# Patient Record
Sex: Male | Born: 2005 | Race: Black or African American | Hispanic: No | Marital: Single | Smoking: Former smoker
Health system: Southern US, Community
[De-identification: ages and names within clinical notes are randomized; demographics above are authoritative.]

## PROBLEM LIST (undated history)

## (undated) DIAGNOSIS — L732 Hidradenitis suppurativa: Secondary | ICD-10-CM

## (undated) DIAGNOSIS — F909 Attention-deficit hyperactivity disorder, unspecified type: Secondary | ICD-10-CM

## (undated) HISTORY — PX: NO PAST SURGERIES: SHX2092

---
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
## 2024-01-10 ENCOUNTER — Encounter (HOSPITAL_COMMUNITY): Payer: Self-pay | Admitting: Emergency Medicine

## 2024-01-10 ENCOUNTER — Ambulatory Visit (INDEPENDENT_AMBULATORY_CARE_PROVIDER_SITE_OTHER)

## 2024-01-10 ENCOUNTER — Other Ambulatory Visit: Payer: Self-pay

## 2024-01-10 ENCOUNTER — Ambulatory Visit (HOSPITAL_COMMUNITY)
Admission: EM | Admit: 2024-01-10 | Discharge: 2024-01-10 | Disposition: A | Discharge status: Home / Self Care | Attending: Internal Medicine | Admitting: Internal Medicine

## 2024-01-10 DIAGNOSIS — S62101A Fracture of unspecified carpal bone, right wrist, initial encounter for closed fracture: Secondary | ICD-10-CM | POA: Diagnosis not present

## 2024-01-10 DIAGNOSIS — Z8781 Personal history of (healed) traumatic fracture: Secondary | ICD-10-CM | POA: Insufficient documentation

## 2024-01-10 DIAGNOSIS — S62390D Other fracture of second metacarpal bone, right hand, subsequent encounter for fracture with routine healing: Secondary | ICD-10-CM | POA: Insufficient documentation

## 2024-01-10 DIAGNOSIS — M79641 Pain in right hand: Secondary | ICD-10-CM | POA: Diagnosis not present

## 2024-01-10 DIAGNOSIS — Z202 Contact with and (suspected) exposure to infections with a predominantly sexual mode of transmission: Secondary | ICD-10-CM | POA: Diagnosis not present

## 2024-01-10 DIAGNOSIS — S62320A Displaced fracture of shaft of second metacarpal bone, right hand, initial encounter for closed fracture: Secondary | ICD-10-CM | POA: Diagnosis not present

## 2024-01-10 DIAGNOSIS — Z9189 Other specified personal risk factors, not elsewhere classified: Secondary | ICD-10-CM | POA: Insufficient documentation

## 2024-01-10 DIAGNOSIS — Z9889 Other specified postprocedural states: Secondary | ICD-10-CM | POA: Diagnosis not present

## 2024-01-10 MED ORDER — CEFTRIAXONE SODIUM 500 MG IJ SOLR
500.0000 mg | INTRAMUSCULAR | Status: DC
  Administered 2024-01-10: 500 mg via INTRAMUSCULAR

## 2024-01-10 MED ORDER — CEFTRIAXONE SODIUM 500 MG IJ SOLR
INTRAMUSCULAR | Status: AC
  Filled 2024-01-10: qty 500

## 2024-01-10 MED ORDER — STERILE WATER FOR INJECTION IJ SOLN
INTRAMUSCULAR | Status: AC
  Filled 2024-01-10: qty 10

## 2024-01-10 NOTE — ED Triage Notes (Signed)
 Pt is requesting to be check for STD and to have x ray done to right hand due to pain for the past 3 days.

## 2024-01-10 NOTE — ED Provider Notes (Signed)
 [facility]    CSN: [id number] Arrival date & time: 01/10/24  1225      History   Chief Complaint Chief Complaint  Patient presents with   Exposure to STD   Hand Pain    HPI Craig Vang is a 18 y.o. male.   Craig Vang is a 18 y.o. male presenting for chief complaint of Exposure to STD and Hand Pain. His recent unprotected sexual partner recently tested positive for gonorrhea and trichomonas at urgent care. Patient would like to be tested for all STDs including HIV and syphilis. No other known exposures to STDs. He denies urinary symptoms, penile discharge, penile rash, penile itching, N/V/D, abdominal pain, rash, and fever/chills.   Additionally would like evaluation of postoperative pain to right hand that started 2 days ago. He is status post ORIF of right index metacarpal fracture. Surgery performed by Dr. Merlyn Lot on 11/04/2023, he attended a few follow-up appointment initially and then stopped going because he missed one of his appointments. Pain over the surgical site started 2 days ago and is worsened by movement. Requests x-ray today to confirm rod/screws are in place. No redness, swelling, pus from the incision. Denies new injuries and numbness/tingling distally to surgical site. Taking tylenol for pain with minimal relief.    Exposure to STD  Hand Pain    Past Medical History:  Diagnosis Date   ADHD (attention deficit hyperactivity disorder)    Hidradenitis suppurativa     There are no active problems to display for this patient.   Past Surgical History:  Procedure Laterality Date   NO PAST SURGERIES     OPEN REDUCTION INTERNAL FIXATION (ORIF) METACARPAL Right 11/04/2023   Procedure: OPEN REDUCTION INTERNAL FIXATION RIGHT INDEX METACARPAL FRACTURE;  Surgeon: Betha Loa, MD;  Location: [facility];  Service: Orthopedics;  Laterality: Right;       Home Medications    Prior to Admission medications   Medication Sig Start Date  End Date Taking? Authorizing Provider  oxyCODONE-acetaminophen (PERCOCET) 5-325 MG tablet Take 1 tablet by mouth every 6 (six) hours as needed. 11/04/23   Betha Loa, MD    Family History History reviewed. No pertinent family history.  Social History Social History   Tobacco Use   Smoking status: Some Days    Types: Cigars  Vaping Use   Vaping status: Some Days  Substance Use Topics   Alcohol use: Yes    Comment: occasionally   Drug use: Yes    Types: Marijuana     Allergies   Patient has no known allergies.   Review of Systems Review of Systems Per HPI  Physical Exam Triage Vital Signs ED Triage Vitals [01/10/24 1316]  Encounter Vitals Group     BP 126/85     Systolic BP Percentile      Diastolic BP Percentile      Pulse Rate 92     Resp 18     Temp 98 F (36.7 C)     Temp Source Oral     SpO2 98 %     Weight      Height      Head Circumference      Peak Flow      Pain Score 8     Pain Loc      Pain Education      Exclude from Growth Chart    No data found.  Updated Vital Signs BP 126/85 (BP Location: Right Arm)   Pulse  92   Temp 98 F (36.7 C) (Oral)   Resp 18   SpO2 98%   Visual Acuity Right Eye Distance:   Left Eye Distance:   Bilateral Distance:    Right Eye Near:   Left Eye Near:    Bilateral Near:     Physical Exam Vitals and nursing note reviewed.  Constitutional:      Appearance: He is not ill-appearing or toxic-appearing.  HENT:     Head: Normocephalic and atraumatic.     Right Ear: Hearing and external ear normal.     Left Ear: Hearing and external ear normal.     Nose: Nose normal.     Mouth/Throat:     Lips: Pink.  Eyes:     General: Lids are normal. Vision grossly intact. Gaze aligned appropriately.     Extraocular Movements: Extraocular movements intact.     Conjunctiva/sclera: Conjunctivae normal.  Pulmonary:     Effort: Pulmonary effort is normal.  Genitourinary:    Comments: Deferred. Musculoskeletal:      Right wrist: Normal.     Right hand: Tenderness (TTP over the dorsum of the right 2nd metacarpal to the medial portion of the surgical incision.) present. No swelling, deformity, lacerations or bony tenderness. Normal range of motion. Normal strength. Normal sensation. There is no disruption of two-point discrimination. Normal capillary refill. Normal pulse.       Hands:     Cervical back: Neck supple.     Comments: Surgical incision closely approximated, no wound dehiscence. +2 right radial pulse. 5/5 grip strength. Sensation intact distally. Less than 3 cap refill.   Skin:    General: Skin is warm and dry.     Capillary Refill: Capillary refill takes less than 2 seconds.     Findings: No rash.  Neurological:     General: No focal deficit present.     Mental Status: He is alert and oriented to person, place, and time. Mental status is at baseline.     Cranial Nerves: No dysarthria or facial asymmetry.  Psychiatric:        Mood and Affect: Mood normal.        Speech: Speech normal.        Behavior: Behavior normal.        Thought Content: Thought content normal.        Judgment: Judgment normal.      UC Treatments / Results  Labs (all labs ordered are listed, but only abnormal results are displayed) Labs Reviewed  RPR  HIV ANTIBODY (ROUTINE TESTING W REFLEX)  CYTOLOGY, (ORAL, ANAL, URETHRAL) ANCILLARY ONLY    EKG   Radiology DG Hand Complete Right Result Date: 01/10/2024 CLINICAL DATA:  Recent hand surgery EXAM: RIGHT HAND - COMPLETE 3+ VIEW COMPARISON:  None Available. FINDINGS: Internal plate fixation of midshaft second metacarpal fracture. Callus about the midshaft fracture. No complicating features. No soft tissue abnormality identified. IMPRESSION: Internal fixation midshaft second carpal fracture without radiographic complication Electronically Signed   By: Genevive Bi M.D.   On: 01/10/2024 14:38    Procedures Procedures (including critical care  time)  Medications Ordered in UC Medications  cefTRIAXone (ROCEPHIN) injection 500 mg (500 mg Intramuscular Given 01/10/24 1430)    Initial Impression / Assessment and Plan / UC Course  I have reviewed the triage vital signs and the nursing notes.  Pertinent labs & imaging results that were available during my care of the patient were reviewed by me and considered in my  medical decision making (see chart for details).   1. Right hand pain, status post ORIF of fracture, closed nondisplaced fracture of other part of second metacarpal bone of right hand subsequent encounter Right hand x-rays show no acute bony abnormality.  Routine healing of second metacarpal bone fracture.  Advise follow-up with Dr. Merlyn Lot hand surgeon for ongoing evaluation and management of postoperative hand pain.  May use Tylenol and ibuprofen as needed for pain and inflammation.  2.  At risk for STD due to unprotected sex, exposure to gonorrhea, exposure to trichomonas High clinical suspicion for gonorrhea infection given presentation, therefore will go ahead and empirically treat with rocephin.   STI labs pending, will notify patient of positive results and treat accordingly when labs result for other infection(s). Patient agrees to HIV and syphilis testing today.   Safe sex precautions discussed, advised to notify sexual partners of positive test results.    Counseled patient on potential for adverse effects with medications prescribed/recommended today, strict ER and return-to-clinic precautions discussed, patient verbalized understanding.   Final Clinical Impressions(s) / UC Diagnoses   Final diagnoses:  Right hand pain  Status post open reduction and internal fixation (ORIF) of fracture  Closed nondisplaced fracture of other part of second metacarpal bone of right hand with routine healing, subsequent encounter  At risk for sexually transmitted disease due to unprotected sex  Exposure to gonorrhea  Exposure to  trichomonas     Discharge Instructions      Your hand x-rays look like your fracture is healing normally.  Screws are in place.  Schedule a follow-up appointment with hand surgeon.   STD testing pending, this will take 2-3 days to result. We will only call you if your testing is positive for any infection(s) and we will provide treatment.  Avoid sexual intercourse until your STD results come back.  If any of your STD results are positive, you will need to avoid sexual intercourse for 7 days while you are being treated to prevent spread of STD.  Condom use is the best way to prevent spread of STDs. Notify partner(s) of any positive results.  Return to urgent care as needed.      ED Prescriptions   None    PDMP not reviewed this encounter.   Carlisle Beers, [place] 01/10/24 1454

## 2024-01-10 NOTE — Discharge Instructions (Signed)
 Your hand x-rays look like your fracture is healing normally.  Screws are in place.  Schedule a follow-up appointment with hand surgeon.   STD testing pending, this will take 2-3 days to result. We will only call you if your testing is positive for any infection(s) and we will provide treatment.  Avoid sexual intercourse until your STD results come back.  If any of your STD results are positive, you will need to avoid sexual intercourse for 7 days while you are being treated to prevent spread of STD.  Condom use is the best way to prevent spread of STDs. Notify partner(s) of any positive results.  Return to urgent care as needed.

## 2024-01-10 NOTE — ED Notes (Signed)
 Pt refused blood draw . This Clinical research associate reported to provider Pt refused blood work.

## 2024-01-12 LAB — CYTOLOGY, (ORAL, ANAL, URETHRAL) ANCILLARY ONLY
Chlamydia: NEGATIVE
Comment: NEGATIVE
Comment: NEGATIVE
Comment: NORMAL
Neisseria Gonorrhea: NEGATIVE
Trichomonas: POSITIVE — AB

## 2024-01-13 ENCOUNTER — Telehealth (HOSPITAL_COMMUNITY): Payer: Self-pay

## 2024-01-13 MED ORDER — METRONIDAZOLE 500 MG PO TABS
2000.0000 mg | ORAL_TABLET | Freq: Once | ORAL | 0 refills | Status: AC
Start: 2024-01-13 — End: 2024-01-13

## 2024-01-13 NOTE — Telephone Encounter (Signed)
 Per protocol, pt requires tx with metronidazole. Attempted to reach patient x1. LVM.  Rx sent to pharmacy on file.

## 2024-03-29 DIAGNOSIS — Z113 Encounter for screening for infections with a predominantly sexual mode of transmission: Secondary | ICD-10-CM | POA: Diagnosis not present

## 2024-05-22 ENCOUNTER — Ambulatory Visit (HOSPITAL_COMMUNITY)
Admission: EM | Admit: 2024-05-22 | Discharge: 2024-05-22 | Disposition: A | Discharge status: Home / Self Care | Attending: Physician Assistant | Admitting: Physician Assistant

## 2024-05-22 ENCOUNTER — Encounter (HOSPITAL_COMMUNITY): Payer: Self-pay

## 2024-05-22 DIAGNOSIS — Z113 Encounter for screening for infections with a predominantly sexual mode of transmission: Secondary | ICD-10-CM | POA: Diagnosis not present

## 2024-05-22 LAB — HIV ANTIBODY (ROUTINE TESTING W REFLEX): HIV Screen 4th Generation wRfx: NONREACTIVE

## 2024-05-22 NOTE — ED Provider Notes (Signed)
 [facility]    CSN: [id number] Arrival date & time: 05/22/24  1654      History   Chief Complaint Chief Complaint  Patient presents with   SEXUALLY TRANSMITTED DISEASE    HPI Craig Vang is a 18 y.o. male.   Patient presents for STD screening.  He denies symptoms at this time.    Past Medical History:  Diagnosis Date   ADHD (attention deficit hyperactivity disorder)    Hidradenitis suppurativa     There are no active problems to display for this patient.   Past Surgical History:  Procedure Laterality Date   NO PAST SURGERIES     OPEN REDUCTION INTERNAL FIXATION (ORIF) METACARPAL Right 11/04/2023   Procedure: OPEN REDUCTION INTERNAL FIXATION RIGHT INDEX METACARPAL FRACTURE;  Surgeon: Murrell Drivers, MD;  Location: [facility];  Service: Orthopedics;  Laterality: Right;       Home Medications    Prior to Admission medications   Medication Sig Start Date End Date Taking? Authorizing Provider  oxyCODONE -acetaminophen  (PERCOCET) 5-325 MG tablet Take 1 tablet by mouth every 6 (six) hours as needed. 11/04/23   Murrell Drivers, MD    Family History History reviewed. No pertinent family history.  Social History Social History   Tobacco Use   Smoking status: Some Days    Types: Cigars  Vaping Use   Vaping status: Some Days  Substance Use Topics   Alcohol use: Yes    Comment: occasionally   Drug use: Yes    Types: Marijuana     Allergies   Patient has no known allergies.   Review of Systems Review of Systems  Constitutional:  Negative for chills and fever.  HENT:  Negative for ear pain and sore throat.   Eyes:  Negative for pain and visual disturbance.  Respiratory:  Negative for cough and shortness of breath.   Cardiovascular:  Negative for chest pain and palpitations.  Gastrointestinal:  Negative for abdominal pain and vomiting.  Genitourinary:  Negative for dysuria and hematuria.  Musculoskeletal:  Negative for arthralgias  and back pain.  Skin:  Negative for color change and rash.  Neurological:  Negative for seizures and syncope.  All other systems reviewed and are negative.    Physical Exam Triage Vital Signs ED Triage Vitals [05/22/24 1740]  Encounter Vitals Group     BP 131/78     Girls Systolic BP Percentile      Girls Diastolic BP Percentile      Boys Systolic BP Percentile      Boys Diastolic BP Percentile      Pulse Rate 76     Resp 16     Temp 98.3 F (36.8 C)     Temp Source Oral     SpO2 97 %     Weight      Height      Head Circumference      Peak Flow      Pain Score 0     Pain Loc      Pain Education      Exclude from Growth Chart    No data found.  Updated Vital Signs BP 131/78 (BP Location: Left Arm)   Pulse 76   Temp 98.3 F (36.8 C) (Oral)   Resp 16   SpO2 97%   Visual Acuity Right Eye Distance:   Left Eye Distance:   Bilateral Distance:    Right Eye Near:   Left Eye Near:    Bilateral  Near:     Physical Exam Vitals and nursing note reviewed.  Constitutional:      General: He is not in acute distress.    Appearance: He is well-developed.  HENT:     Head: Normocephalic and atraumatic.  Eyes:     Conjunctiva/sclera: Conjunctivae normal.  Cardiovascular:     Rate and Rhythm: Normal rate and regular rhythm.     Heart sounds: No murmur heard. Pulmonary:     Effort: Pulmonary effort is normal. No respiratory distress.     Breath sounds: Normal breath sounds.  Abdominal:     Palpations: Abdomen is soft.     Tenderness: There is no abdominal tenderness.  Musculoskeletal:        General: No swelling.     Cervical back: Neck supple.  Skin:    General: Skin is warm and dry.     Capillary Refill: Capillary refill takes less than 2 seconds.  Neurological:     Mental Status: He is alert.  Psychiatric:        Mood and Affect: Mood normal.      UC Treatments / Results  Labs (all labs ordered are listed, but only abnormal results are displayed) Labs  Reviewed  HIV ANTIBODY (ROUTINE TESTING W REFLEX)  RPR  CYTOLOGY, (ORAL, ANAL, URETHRAL) ANCILLARY ONLY    EKG   Radiology No results found.  Procedures Procedures (including critical care time)  Medications Ordered in UC Medications - No data to display  Initial Impression / Assessment and Plan / UC Course  I have reviewed the triage vital signs and the nursing notes.  Pertinent labs & imaging results that were available during my care of the patient were reviewed by me and considered in my medical decision making (see chart for details).     Cytology self swab in clinic today.  HIV and RPR done as well.  Pending results.  Will initiate treatment if indicated based on results. Final Clinical Impressions(s) / UC Diagnoses   Final diagnoses:  Screening examination for STD (sexually transmitted disease)     Discharge Instructions      Will call with test results and initiate treatment if indicated Abstain from sexual activity until results are back and any necessary treatment is completed.    ED Prescriptions   None    PDMP not reviewed this encounter.   Ward, Harlene PEDLAR, PA-C 05/22/24 1800

## 2024-05-22 NOTE — ED Triage Notes (Signed)
 Pt states he wants to be tested for STD's denies having any symptoms.

## 2024-05-22 NOTE — Discharge Instructions (Addendum)
 Will call with test results and initiate treatment if indicated Abstain from sexual activity until results are back and any necessary treatment is completed.

## 2024-05-23 LAB — RPR: RPR Ser Ql: NONREACTIVE

## 2024-05-24 LAB — CYTOLOGY, (ORAL, ANAL, URETHRAL) ANCILLARY ONLY
Chlamydia: NEGATIVE
Comment: NEGATIVE
Comment: NEGATIVE
Comment: NORMAL
Neisseria Gonorrhea: NEGATIVE
Trichomonas: NEGATIVE

## 2024-06-25 ENCOUNTER — Ambulatory Visit (HOSPITAL_COMMUNITY): Admission: EM | Admit: 2024-06-25 | Discharge: 2024-06-25 | Discharge status: Against Medical Advice

## 2024-06-25 NOTE — ED Notes (Signed)
Called from waiting area without response. 

## 2024-06-25 NOTE — ED Notes (Signed)
No response from waiting room  

## 2024-06-25 NOTE — ED Notes (Signed)
 No response from waiting area when pt called.

## 2024-09-16 ENCOUNTER — Ambulatory Visit (HOSPITAL_COMMUNITY): Admission: EM | Admit: 2024-09-16 | Discharge: 2024-09-16 | Disposition: A | Discharge status: Home / Self Care

## 2024-09-16 ENCOUNTER — Encounter (HOSPITAL_COMMUNITY): Payer: Self-pay

## 2024-09-16 DIAGNOSIS — J039 Acute tonsillitis, unspecified: Secondary | ICD-10-CM | POA: Insufficient documentation

## 2024-09-16 DIAGNOSIS — J029 Acute pharyngitis, unspecified: Secondary | ICD-10-CM | POA: Insufficient documentation

## 2024-09-16 DIAGNOSIS — Z113 Encounter for screening for infections with a predominantly sexual mode of transmission: Secondary | ICD-10-CM | POA: Diagnosis not present

## 2024-09-16 LAB — HEPATITIS PANEL, ACUTE
HCV Ab: NONREACTIVE
Hep A IgM: NONREACTIVE
Hep B C IgM: NONREACTIVE
Hepatitis B Surface Ag: NONREACTIVE

## 2024-09-16 LAB — POCT RAPID STREP A (OFFICE): Rapid Strep A Screen: NEGATIVE

## 2024-09-16 LAB — HIV ANTIBODY (ROUTINE TESTING W REFLEX): HIV Screen 4th Generation wRfx: NONREACTIVE

## 2024-09-16 LAB — POCT MONO SCREEN (KUC): Mono, POC: NEGATIVE

## 2024-09-16 MED ORDER — NAPROXEN 500 MG PO TABS: 500.0000 mg | ORAL_TABLET | Freq: Two times a day (BID) | ORAL | 0 refills | Status: AC

## 2024-09-16 MED ORDER — PREDNISONE 10 MG PO TABS: 20.0000 mg | ORAL_TABLET | Freq: Every day | ORAL | 0 refills | Status: AC

## 2024-09-16 NOTE — ED Triage Notes (Signed)
 Patient here today to be tested for all Stds. Patient states that his throat has been feeling tight for the past 2 weeks.

## 2024-09-16 NOTE — ED Provider Notes (Signed)
 [facility]    CSN: [id number] Arrival date & time: 09/16/24  1104      History   Chief Complaint Chief Complaint  Patient presents with   SEXUALLY TRANSMITTED DISEASE    HPI Craig Vang is a 18 y.o. male.   HPI  Past Medical History:  Diagnosis Date   ADHD (attention deficit hyperactivity disorder)    Hidradenitis suppurativa     There are no active problems to display for this patient.   Past Surgical History:  Procedure Laterality Date   NO PAST SURGERIES     OPEN REDUCTION INTERNAL FIXATION (ORIF) METACARPAL Right 11/04/2023   Procedure: OPEN REDUCTION INTERNAL FIXATION RIGHT INDEX METACARPAL FRACTURE;  Surgeon: Murrell Drivers, MD;  Location: [facility];  Service: Orthopedics;  Laterality: Right;       Home Medications    Prior to Admission medications   Medication Sig Start Date End Date Taking? Authorizing Provider  predniSONE (DELTASONE) 10 MG tablet Take 2 tablets (20 mg total) by mouth daily for 5 days. 09/16/24 09/21/24 Yes Dunaway, Krystal HERO, DO    Family History History reviewed. No pertinent family history.  Social History Social History   Tobacco Use   Smoking status: Former    Types: Systems Developer   Vaping status: Former  Substance Use Topics   Alcohol use: Yes    Comment: occasionally   Drug use: Yes    Types: Marijuana     Allergies   Patient has no known allergies.   Review of Systems Review of Systems   Physical Exam Triage Vital Signs ED Triage Vitals  Encounter Vitals Group     BP 09/16/24 1146 119/71     Girls Systolic BP Percentile --      Girls Diastolic BP Percentile --      Boys Systolic BP Percentile --      Boys Diastolic BP Percentile --      Pulse Rate 09/16/24 1146 95     Resp 09/16/24 1146 16     Temp 09/16/24 1146 99.6 F (37.6 C)     Temp Source 09/16/24 1146 Oral     SpO2 09/16/24 1146 96 %     Weight --      Height --      Head Circumference --      Peak Flow --       Pain Score 09/16/24 1145 0     Pain Loc --      Pain Education --      Exclude from Growth Chart --    No data found.  Updated Vital Signs BP 119/71 (BP Location: Left Arm)   Pulse 95   Temp 99.6 F (37.6 C) (Oral)   Resp 16   SpO2 96%   Visual Acuity Right Eye Distance:   Left Eye Distance:   Bilateral Distance:    Right Eye Near:   Left Eye Near:    Bilateral Near:     Physical Exam   UC Treatments / Results  Labs (all labs ordered are listed, but only abnormal results are displayed) Labs Reviewed  HIV ANTIBODY (ROUTINE TESTING W REFLEX)  RPR  HEPATITIS PANEL, ACUTE  POCT MONO SCREEN ([facility])  POCT RAPID STREP A (OFFICE)  CYTOLOGY, (ORAL, ANAL, URETHRAL) ANCILLARY ONLY    EKG   Radiology No results found.  Procedures Procedures (including critical care time)  Medications Ordered in UC Medications - No data to display  Initial Impression /  Assessment and Plan / UC Course  I have reviewed the triage vital signs and the nursing notes.  Pertinent labs & imaging results that were available during my care of the patient were reviewed by me and considered in my medical decision making (see chart for details).      Final Clinical Impressions(s) / UC Diagnoses   Final diagnoses:  Screen for STD (sexually transmitted disease)  Acute tonsillitis, unspecified etiology   Discharge Instructions   None    ED Prescriptions     Medication Sig Dispense Auth. Provider   predniSONE (DELTASONE) 10 MG tablet Take 2 tablets (20 mg total) by mouth daily for 5 days. 10 tablet Dunaway, Todd M, DO      PDMP not reviewed this encounter.   Darral Longs, MD 09/16/24 [phone]

## 2024-09-16 NOTE — ED Provider Notes (Signed)
  PCP: Robynn Ip, MD Chief Complaint: SEXUALLY TRANSMITTED DISEASE    Subjective:   HPI: Patient is a 18 y.o. male here for STD screening as well as sore throat for 2 weeks.  Patient states that he comes in periodically for all STD screenings just because he likes to know.  He denies any risky sexual behavior.  He states for the last 2 weeks he has had a sore throat, and the feeling of glass anytime he swallows.  He denies any other symptoms of fever, congestion, cough, body aches, chills.  He states that sometimes he has some difficulty with breathing with this.  Past Medical History:  Diagnosis Date   ADHD (attention deficit hyperactivity disorder)    Hidradenitis suppurativa     No current facility-administered medications on file prior to encounter.   No current outpatient medications on file prior to encounter.    BP 119/71 (BP Location: Left Arm)   Pulse 95   Temp 99.6 F (37.6 C) (Oral)   Resp 16   SpO2 96%        Objective:   Gen: Well developed, well nourished male in no acute distress. HEENT: Pupils equal, round, and reactive to light.  Conjunctiva non-injected.  Nares patent without discharge.  Oral mucosa is moist and pink.  Posterior pharynx is erythematous, tonsils are +3-4, exudate visualized, anterior cervical lymphadenopathy palpable, bilateral tympanic membranes are pearly gray, no masses seen in the posterior pharynx CV: Regular rate and rhythm without murmurs, gallops, or rubs. Lungs: Clear to auscultation bilaterally with good effort MSK: Joints and muscles are symmetrical with no swelling, redness, or deformity. Ext: No cyanosis, clubbing, or edema.  Assessment/Plan:   Craig Vang is a 18 y.o. male who was seen today for the following: 1. Acute pharyngitis, unspecified etiology (Primary) 2. Tonsillitis  - POC mono screen - POC rapid strep A - both tests negative  - vitals stable  - Due to size of tonsils will send in a short course of  steroids  - Should there be any changes to patient's symptoms, he is to seek emergency medical care  - Follow up as needed   2. Screen for STD (sexually transmitted disease) - Pending, C/G/T - Pending HIV, RPR, Hep Panel  - Will call with results   Follow-up/Education:   May return sooner as needed and encouraged to call/e-mail for additional questions or  worsening symptoms in the interim.  Krystal Lowing, DO Sports Medicine Fellow 09/16/2024 1:04 PM  Disclaimer: This transcription was electronically signed. It was transcribed by [place] and may contain errors in the text that were not recognized on proofreading.     Lowing Krystal HERO, DO 09/16/24 1305

## 2024-09-17 ENCOUNTER — Ambulatory Visit (HOSPITAL_COMMUNITY): Payer: Self-pay

## 2024-09-17 LAB — CYTOLOGY, (ORAL, ANAL, URETHRAL) ANCILLARY ONLY
Chlamydia: NEGATIVE
Comment: NEGATIVE
Comment: NEGATIVE
Comment: NORMAL
Neisseria Gonorrhea: POSITIVE — AB
Trichomonas: NEGATIVE

## 2024-09-17 LAB — SYPHILIS: RPR W/REFLEX TO RPR TITER AND TREPONEMAL ANTIBODIES, TRADITIONAL SCREENING AND DIAGNOSIS ALGORITHM: RPR Ser Ql: NONREACTIVE

## 2024-10-01 ENCOUNTER — Ambulatory Visit (HOSPITAL_COMMUNITY)
Admission: EM | Admit: 2024-10-01 | Discharge: 2024-10-01 | Disposition: A | Discharge status: Home / Self Care | Source: Ambulatory Visit | Attending: Emergency Medicine | Admitting: Emergency Medicine

## 2024-10-01 ENCOUNTER — Encounter (HOSPITAL_COMMUNITY): Payer: Self-pay

## 2024-10-01 DIAGNOSIS — A549 Gonococcal infection, unspecified: Secondary | ICD-10-CM

## 2024-10-01 DIAGNOSIS — J029 Acute pharyngitis, unspecified: Secondary | ICD-10-CM | POA: Diagnosis not present

## 2024-10-01 LAB — POCT RAPID STREP A (OFFICE): Rapid Strep A Screen: NEGATIVE

## 2024-10-01 MED ORDER — CEFTRIAXONE SODIUM 500 MG IJ SOLR
500.0000 mg | INTRAMUSCULAR | Status: DC
  Administered 2024-10-01: 500 mg via INTRAMUSCULAR

## 2024-10-01 MED ORDER — LIDOCAINE HCL (PF) 1 % IJ SOLN
INTRAMUSCULAR | Status: AC
  Filled 2024-10-01: qty 2

## 2024-10-01 MED ORDER — CEFTRIAXONE SODIUM 500 MG IJ SOLR
INTRAMUSCULAR | Status: AC
  Filled 2024-10-01: qty 500

## 2024-10-01 NOTE — Discharge Instructions (Signed)
 You received an injection of Rocephin  in clinic today to treat previously positive test for gonorrhea. We retested you for strep and the rapid test was negative today, we will send a culture of this just to confirm. We have also performed an oral cytology swab which we will check for oral gonorrhea and chlamydia. If your results come back positive for gonorrhea you have already been treated for this while in clinic today with a one-time injection and will not need to return for any additional treatment. If any of your results are concerning and require any additional treatment someone will call and advise treatment at that time. Alternate between 650 mg of Tylenol  and 400 to 600 mg of ibuprofen every 6-8 hours as needed for sore throat. Make sure you are staying hydrated getting plenty of rest Follow-up with your primary care provider or return here as needed. If you develop worsening throat swelling, difficulty breathing,  persistent high fevers, or severe weakness please seek any medical treatment in the emergency department.

## 2024-10-01 NOTE — ED Provider Notes (Signed)
 [facility]    CSN: [id number] Arrival date & time: 10/01/24  0920      History   Chief Complaint Chief Complaint  Patient presents with   Sore Throat   SEXUALLY TRANSMITTED DISEASE    HPI Craig Vang is a 18 y.o. male.   Patient presents with continued sore throat after being seen on 11/20 for same.  Patient was seen 11/20 for sore throat that had already been going on for 2 weeks at that time without any relief.  Patient denies any fever, body aches, chills, or weakness.  Patient denies any cough, congestion, chest pain, shortness of breath, nausea, vomiting, and diarrhea.  Patient was also tested for STDs during the visit on 11/20 and his urethral swab was positive for gonorrhea.  Patient had a rapid strep test and mono testing that were both negative at his visit on 11/20 however no further testing was done for this.  Patient was started on prednisone  on that time for pharyngeal swelling.  Patient states that this did not provide him any relief.  Patient was later informed that he did test positive for gonorrhea with his urethral swab and therefore he is returning today to receive this as well as have his sore throat be reevaluated.  Patient states that he has been able to keep down liquids despite sore throat and swelling.  Patient denies any difficulty breathing.  The history is provided by the patient and medical records.  Sore Throat    Past Medical History:  Diagnosis Date   ADHD (attention deficit hyperactivity disorder)    Hidradenitis suppurativa     There are no active problems to display for this patient.   Past Surgical History:  Procedure Laterality Date   NO PAST SURGERIES     OPEN REDUCTION INTERNAL FIXATION (ORIF) METACARPAL Right 11/04/2023   Procedure: OPEN REDUCTION INTERNAL FIXATION RIGHT INDEX METACARPAL FRACTURE;  Surgeon: Murrell Drivers, MD;  Location: [facility];  Service: Orthopedics;  Laterality: Right;        Home Medications    Prior to Admission medications   Not on File    Family History History reviewed. No pertinent family history.  Social History Social History   Tobacco Use   Smoking status: Former    Types: Systems Developer   Vaping status: Former  Substance Use Topics   Alcohol use: Yes    Comment: occasionally   Drug use: Yes    Types: Marijuana     Allergies   Patient has no known allergies.   Review of Systems Review of Systems  Per HPI  Physical Exam Triage Vital Signs ED Triage Vitals  Encounter Vitals Group     BP 10/01/24 0945 137/75     Girls Systolic BP Percentile --      Girls Diastolic BP Percentile --      Boys Systolic BP Percentile --      Boys Diastolic BP Percentile --      Pulse Rate 10/01/24 0945 77     Resp 10/01/24 0945 16     Temp 10/01/24 0945 98.7 F (37.1 C)     Temp Source 10/01/24 0945 Oral     SpO2 10/01/24 0945 98 %     Weight --      Height --      Head Circumference --      Peak Flow --      Pain Score 10/01/24 0943 6  Pain Loc --      Pain Education --      Exclude from Growth Chart --    No data found.  Updated Vital Signs BP 137/75 (BP Location: Left Arm)   Pulse 77   Temp 98.7 F (37.1 C) (Oral)   Resp 16   SpO2 98%   Visual Acuity Right Eye Distance:   Left Eye Distance:   Bilateral Distance:    Right Eye Near:   Left Eye Near:    Bilateral Near:     Physical Exam Vitals and nursing note reviewed.  Constitutional:      General: He is awake. He is not in acute distress.    Appearance: Normal appearance. He is well-developed and well-groomed. He is not ill-appearing.  HENT:     Right Ear: Tympanic membrane, ear canal and external ear normal.     Left Ear: Tympanic membrane, ear canal and external ear normal.     Nose: Nose normal.     Mouth/Throat:     Mouth: Mucous membranes are moist.     Pharynx: Pharyngeal swelling, oropharyngeal exudate and posterior oropharyngeal  erythema present. No uvula swelling.     Tonsils: Tonsillar exudate present. 3+ on the right. 3+ on the left.  Cardiovascular:     Rate and Rhythm: Normal rate and regular rhythm.  Pulmonary:     Effort: Pulmonary effort is normal.     Breath sounds: Normal breath sounds.  Skin:    General: Skin is warm and dry.  Neurological:     Mental Status: He is alert.  Psychiatric:        Behavior: Behavior is cooperative.      UC Treatments / Results  Labs (all labs ordered are listed, but only abnormal results are displayed) Labs Reviewed  CULTURE, GROUP A STREP [facility])  POCT RAPID STREP A (OFFICE)  CYTOLOGY, (ORAL, ANAL, URETHRAL) ANCILLARY ONLY    EKG   Radiology No results found.  Procedures Procedures (including critical care time)  Medications Ordered in UC Medications  cefTRIAXone  (ROCEPHIN ) injection 500 mg (500 mg Intramuscular Given 10/01/24 1014)    Initial Impression / Assessment and Plan / UC Course  I have reviewed the triage vital signs and the nursing notes.  Pertinent labs & imaging results that were available during my care of the patient were reviewed by me and considered in my medical decision making (see chart for details).     Patient is overall well-appearing.  Vitals are stable.  Rapid strep is negative, will send culture to confirm.  Oral cytology swab also performed due to recent urethral swab being positive for gonorrhea there is concern for also having contracted oral gonorrhea.  Given IM Rocephin  in clinic for treatment of previous positive result for gonorrhea.  Offered patient Decadron  injection in clinic due to significant amount of pharyngeal swelling, but patient declined.  Patient also declined prescription of prednisone  due to taking this previously without any relief.  Recommended alternate between Tylenol  and ibuprofen as needed for pain and swelling.  Discussed follow-up, return, and strict ER precautions. Final Clinical Impressions(s) /  UC Diagnoses   Final diagnoses:  Sore throat  Pharyngitis, unspecified etiology  Gonorrhea     Discharge Instructions      You received an injection of Rocephin  in clinic today to treat previously positive test for gonorrhea. We retested you for strep and the rapid test was negative today, we will send a culture of this just to confirm.  We have also performed an oral cytology swab which we will check for oral gonorrhea and chlamydia. If your results come back positive for gonorrhea you have already been treated for this while in clinic today with a one-time injection and will not need to return for any additional treatment. If any of your results are concerning and require any additional treatment someone will call and advise treatment at that time. Alternate between 650 mg of Tylenol  and 400 to 600 mg of ibuprofen every 6-8 hours as needed for sore throat. Make sure you are staying hydrated getting plenty of rest Follow-up with your primary care provider or return here as needed. If you develop worsening throat swelling, difficulty breathing,  persistent high fevers, or severe weakness please seek any medical treatment in the emergency department.     ED Prescriptions   None    PDMP not reviewed this encounter.   Johnie Flaming A, NP 10/01/24 1023

## 2024-10-01 NOTE — ED Triage Notes (Signed)
 Pt states needs treatment for his gonorrhea for when he tested positive on 11/20.  Pt states having sore throat with swollen tonsils since 11/20. States was treated with meds and didn't get any better.

## 2024-10-03 LAB — CULTURE, GROUP A STREP (THRC)

## 2024-10-04 ENCOUNTER — Ambulatory Visit (HOSPITAL_COMMUNITY): Payer: Self-pay

## 2024-10-04 LAB — CYTOLOGY, (ORAL, ANAL, URETHRAL) ANCILLARY ONLY
Chlamydia: NEGATIVE
Comment: NEGATIVE
Comment: NORMAL
Neisseria Gonorrhea: POSITIVE — AB

## 2025-01-26 ENCOUNTER — Ambulatory Visit: Admitting: Family Medicine
